# Patient Record
Sex: Male | Born: 1985 | Race: White | Hispanic: No | Marital: Single | Smoking: Current some day smoker
Health system: Southern US, Community
[De-identification: ages and names within clinical notes are randomized; demographics above are authoritative.]

## PROBLEM LIST (undated history)

## (undated) DIAGNOSIS — F319 Bipolar disorder, unspecified: Secondary | ICD-10-CM

## (undated) DIAGNOSIS — F419 Anxiety disorder, unspecified: Secondary | ICD-10-CM

## (undated) DIAGNOSIS — F32A Depression, unspecified: Secondary | ICD-10-CM

## (undated) DIAGNOSIS — F329 Major depressive disorder, single episode, unspecified: Secondary | ICD-10-CM

## (undated) HISTORY — DX: Depression, unspecified: F32.A

## (undated) HISTORY — DX: Bipolar disorder, unspecified: F31.9

## (undated) HISTORY — DX: Anxiety disorder, unspecified: F41.9

## (undated) HISTORY — DX: Major depressive disorder, single episode, unspecified: F32.9

## (undated) HISTORY — PX: WISDOM TOOTH EXTRACTION: SHX21

## (undated) HISTORY — PX: WRIST ARTHROSCOPY: SUR100

---
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
## 2011-11-10 ENCOUNTER — Ambulatory Visit (INDEPENDENT_AMBULATORY_CARE_PROVIDER_SITE_OTHER): Payer: BC Managed Care – PPO

## 2011-11-10 DIAGNOSIS — J069 Acute upper respiratory infection, unspecified: Secondary | ICD-10-CM

## 2011-11-10 DIAGNOSIS — J209 Acute bronchitis, unspecified: Secondary | ICD-10-CM

## 2011-12-02 ENCOUNTER — Ambulatory Visit (INDEPENDENT_AMBULATORY_CARE_PROVIDER_SITE_OTHER): Payer: BC Managed Care – PPO | Admitting: Family Medicine

## 2011-12-02 VITALS — BP 122/85 | HR 68 | Temp 97.9°F | Resp 16 | Ht 69.5 in | Wt 277.2 lb

## 2011-12-02 DIAGNOSIS — J40 Bronchitis, not specified as acute or chronic: Secondary | ICD-10-CM

## 2011-12-02 DIAGNOSIS — E669 Obesity, unspecified: Secondary | ICD-10-CM

## 2011-12-02 DIAGNOSIS — F319 Bipolar disorder, unspecified: Secondary | ICD-10-CM

## 2011-12-02 DIAGNOSIS — J0110 Acute frontal sinusitis, unspecified: Secondary | ICD-10-CM

## 2011-12-02 MED ORDER — IPRATROPIUM BROMIDE 0.03 % NA SOLN: 2.0000 | Freq: Three times a day (TID) | NASAL | Status: DC

## 2011-12-02 MED ORDER — CEFDINIR 300 MG PO CAPS: 300.0000 mg | ORAL_CAPSULE | Freq: Two times a day (BID) | ORAL | Status: DC

## 2011-12-02 NOTE — Patient Instructions (Signed)
Take some probiotics to help prevent diarrhea.  Let us know if you are not better in the next few days!

## 2011-12-02 NOTE — Progress Notes (Signed)
  Patient Name: Alex Gibson Mun Date of Birth: Jun 18, 1986 Medical Record Number: [id number] Gender: male Date of Encounter: 12/02/2011  History of Present Illness:  Alex Gibson is a 26 y.o. very pleasant male patient who presents with the following:  Right after Christmas noted onset of chest congestions, runny vs stuffy nose, sinus pressure, ears congested.  Was seen at [facility] on 11/10/11 and treated with amoxicillin.  Did get better temporarily but then sx returned, sinus pressure is worse.  Today noted sweats, felt hot- ? Had a fever.  Occasional chills.  Cough- productive of yellow mucus.    There is no problem list on file for this patient.  No past medical history on file. history of bipolar disorder, well controlled No past surgical history on file. History  Substance Use Topics  . Smoking status: Never Smoker   . Smokeless tobacco: Not on file  . Alcohol Use: Not on file   No family history on file. Allergies no known allergies  Medication list has been reviewed and updated.  Review of Systems: As per HPI.  Mild nausea due to PND, no vomiting.  Had diarrhea last night but better now.  Able to eat ok.  No rashes  Physical Examination: Filed Vitals:   12/02/11 1452  BP: 122/85  Pulse: 68  Temp: 97.9 F (36.6 C)  TempSrc: Oral  Resp: 16  Height: 5' 9.5" (1.765 m)  Weight: 277 lb 3.2 oz (125.737 kg)    Body mass index is 40.35 kg/(m^2).  GEN: WDWN, NAD, Non-toxic, A & O x 3 HEENT: Atraumatic, Normocephalic. Neck supple. No masses, No LAD.  Frontal sinuses are tender Ears and Nose: No external deformity.  Nasal cavity with redness and mucus CV: RRR, No M/G/R. No JVD. No thrill. No extra heart sounds. PULM: CTA B, no wheezes, crackles, rhonchi. No retractions. No resp. distress. No accessory muscle use. ABD: S, NT, ND, +BS. No rebound. No HSM. EXTR: No c/c/e NEURO Normal gait.  PSYCH: Normally interactive. Conversant. Not depressed or anxious appearing.  Calm  demeanor.    Assessment and Plan: 1. Sinusitis, acute frontal  cefdinir (OMNICEF) 300 MG capsule, ipratropium (ATROVENT) 0.03 % nasal spray  2. Bronchitis  cefdinir (OMNICEF) 300 MG capsule    Patient (or parent if minor) instructed to return to clinic or call if not better in 3 day(s).

## 2011-12-04 ENCOUNTER — Ambulatory Visit (INDEPENDENT_AMBULATORY_CARE_PROVIDER_SITE_OTHER): Payer: BC Managed Care – PPO | Admitting: Internal Medicine

## 2011-12-04 ENCOUNTER — Ambulatory Visit: Payer: BC Managed Care – PPO

## 2011-12-04 ENCOUNTER — Encounter: Payer: Self-pay | Admitting: Physician Assistant

## 2011-12-04 VITALS — BP 127/80 | HR 83 | Temp 100.4°F | Resp 16 | Ht 69.0 in | Wt 278.2 lb

## 2011-12-04 DIAGNOSIS — R05 Cough: Secondary | ICD-10-CM

## 2011-12-04 DIAGNOSIS — R52 Pain, unspecified: Secondary | ICD-10-CM

## 2011-12-04 DIAGNOSIS — R509 Fever, unspecified: Secondary | ICD-10-CM

## 2011-12-04 DIAGNOSIS — J157 Pneumonia due to Mycoplasma pneumoniae: Secondary | ICD-10-CM

## 2011-12-04 LAB — POCT CBC
Granulocyte percent: 79 %G (ref 37–80)
HCT, POC: 44.1 % (ref 43.5–53.7)
Hemoglobin: 14.6 g/dL (ref 14.1–18.1)
Lymph, poc: 0.7 (ref 0.6–3.4)
MCH, POC: 28.5 pg (ref 27–31.2)
MCHC: 33.1 g/dL (ref 31.8–35.4)
MCV: 86.2 fL (ref 80–97)
MID (cbc): 0.6 (ref 0–0.9)
MPV: 11.9 fL (ref 0–99.8)
POC Granulocyte: 4.7 (ref 2–6.9)
POC LYMPH PERCENT: 11.6 %L (ref 10–50)
POC MID %: 9.4 %M (ref 0–12)
Platelet Count, POC: 155 10*3/uL (ref 142–424)
RBC: 5.12 M/uL (ref 4.69–6.13)
RDW, POC: 13.8 %
WBC: 6 10*3/uL (ref 4.6–10.2)

## 2011-12-04 LAB — POCT INFLUENZA A/B
Influenza A, POC: NEGATIVE
Influenza B, POC: NEGATIVE

## 2011-12-04 MED ORDER — AZITHROMYCIN 1 G PO PACK: 1.0000 | PACK | Freq: Once | ORAL | Status: DC

## 2011-12-04 MED ORDER — AZITHROMYCIN 250 MG PO TABS: ORAL_TABLET | ORAL | Status: AC

## 2011-12-04 MED ORDER — ACETAMINOPHEN 325 MG PO TABS
500.0000 mg | ORAL_TABLET | Freq: Once | ORAL | Status: AC
  Administered 2011-12-04: 487.5 mg via ORAL

## 2011-12-04 NOTE — Progress Notes (Signed)
  Subjective:    Patient ID: Alex Gibson, male    DOB: 21-Nov-1985, 26 y.o.   MRN: [id number]  HPI @[facility] @  HOPPER,DAVID, MD, MD  26 y.o. y/o CM presents with worsening URI s/sx.  Was here in December and again December 02, 2011 for URI S/sx.  Symptoms worsened acutely yesterday and developed fever and body aches. No flu shot this year Onset Yesterday Location- all over body Duration 1.5 days Severity moderate Exacerbating/Alleviating Factors-tylenol helps  Associated Signs and Symptoms-fever, bodyaches   Status of chronic disease  Past Medical History  Diagnosis Date  . Bipolar disorder     Dr. Alphia Kava.  well controlled    Past Surgical History  Procedure Date  . Wisdom tooth extraction     Review of Systems  Constitutional: Positive for fever, chills and fatigue.  HENT: Positive for congestion and postnasal drip. Negative for rhinorrhea and sneezing.   Eyes: Negative.   Respiratory: Positive for cough.   Cardiovascular: Negative.   Gastrointestinal: Negative.   Musculoskeletal: Positive for myalgias.  Neurological: Positive for headaches.  Hematological: Negative.   Psychiatric/Behavioral: Negative.        Objective:   Physical Exam  Nursing note and vitals reviewed. Constitutional: He is oriented to person, place, and time. He appears well-developed and well-nourished. No distress (obese).  HENT:  Head: Normocephalic and atraumatic.  Right Ear: External ear normal.  Left Ear: External ear normal.  Mouth/Throat: Oropharynx is clear and moist.  Eyes: Conjunctivae and EOM are normal. Pupils are equal, round, and reactive to light.  Neck: Normal range of motion. Neck supple. No thyromegaly present.  Pulmonary/Chest: Effort normal. No respiratory distress. He has no wheezes. He has rales (left base). He exhibits no tenderness.  Musculoskeletal: Normal range of motion.  Lymphadenopathy:    He has no cervical adenopathy.  Neurological: He is alert and  oriented to person, place, and time.  Skin: Skin is warm and dry. No rash noted.  Psychiatric: He has a normal mood and affect.   Results for orders placed in visit on 12/04/11  POCT INFLUENZA A/B      Component Value Range   Influenza A, POC Negative     Influenza B, POC Negative    POCT CBC      Component Value Range   WBC 6.0  4.6 - 10.2 (K/uL)   Lymph, poc 0.7  0.6 - 3.4    POC LYMPH PERCENT 11.6  10 - 50 (%L)   MID (cbc) 0.6  0 - 0.9    POC MID % 9.4  0 - 12 (%M)   POC Granulocyte 4.7  2 - 6.9    Granulocyte percent 79.0  37 - 80 (%G)   RBC 5.12  4.69 - 6.13 (M/uL)   Hemoglobin 14.6  14.1 - 18.1 (g/dL)   HCT, POC 40.9  81.1 - 53.7 (%)   MCV 86.2  80 - 97 (fL)   MCH, POC 28.5  27 - 31.2 (pg)   MCHC 33.1  31.8 - 35.4 (g/dL)   RDW, POC 91.4     Platelet Count, POC 155  142 - 424 (K/uL)   MPV 11.9  0 - 99.8 (fL)   chest X-ray Seen with Dr. Perrin Maltese.  Increased markings R & L base      Assessment & Plan:  URI-cover for atypicals. D/C cefdinir.  Start Zpack.  See AVS

## 2011-12-04 NOTE — Patient Instructions (Signed)
Fluids and rest, Respiratory Care.  Tylenol or Advil if tolerated for fever and body aches.

## 2011-12-08 ENCOUNTER — Ambulatory Visit (INDEPENDENT_AMBULATORY_CARE_PROVIDER_SITE_OTHER): Payer: BC Managed Care – PPO | Admitting: Family Medicine

## 2011-12-08 VITALS — BP 124/70 | HR 77 | Temp 98.0°F | Resp 16 | Ht 69.0 in | Wt 271.2 lb

## 2011-12-08 DIAGNOSIS — R05 Cough: Secondary | ICD-10-CM

## 2011-12-08 DIAGNOSIS — J00 Acute nasopharyngitis [common cold]: Secondary | ICD-10-CM

## 2011-12-08 DIAGNOSIS — J069 Acute upper respiratory infection, unspecified: Secondary | ICD-10-CM

## 2011-12-08 DIAGNOSIS — J310 Chronic rhinitis: Secondary | ICD-10-CM

## 2011-12-08 DIAGNOSIS — R059 Cough, unspecified: Secondary | ICD-10-CM

## 2011-12-08 MED ORDER — FLUTICASONE PROPIONATE 50 MCG/ACT NA SUSP: 2.0000 | Freq: Every day | NASAL | Status: DC

## 2011-12-08 MED ORDER — HYDROCODONE-HOMATROPINE 5-1.5 MG/5ML PO SYRP: 5.0000 mL | ORAL_SOLUTION | Freq: Three times a day (TID) | ORAL | Status: AC | PRN

## 2011-12-08 NOTE — Patient Instructions (Signed)
Discharge Instructions Browse by Alphabet A B C D E F G H I J K L M N O P Q R S T U V W X Y Z  Browse by Category All Documents Allergy and Immunology Anesthesiology Bariatrics Bioterrorism Cardiology Critical Care Dentistry Dermatology Diabetes Dietary Easy-to-Read Emergency Medicine Endocrinology ENT Family Medicine Forms Gastroenterology Geriatrics Infectious Disease Internal Medicine Labs and Tests Neonatology Nephrology Neurology Obstetrics and Gynecology Oncology Ophthalmology Orthopedics Pediatrics Pharmacology Physical Medicine and Rehabilitation Podiatry Preventative Medicine Procedures Psychiatry Pulmonary Medicine Radiology Rheumatology Surgery Urology Drug Information Sheets All Drug Information Sheets  Browse by Alphabet A B C D E F G H I J K L M N O P Q R S T U V W X Y Z  

## 2011-12-08 NOTE — Progress Notes (Signed)
[facility]:  Office Visit  Chief Complaint:  Chief Complaint  Patient presents with  . Pneumonia    dx'd friday    HPI: Alex Gibson is a 26 y.o. male who complains of here for follow-up for URI, have been on 2 rounds of abx, Omnicef and Z-pack. CXR was negative for any infiltrates. He is here with continual complaints of chest and sinus congestion. No fevers or chills.   Past Medical History  Diagnosis Date  . Bipolar disorder     Dr. Alphia Kava.  well controlled   Past Surgical History  Procedure Date  . Wisdom tooth extraction    History   Social History  . Marital Status: Single    Spouse Name: N/A    Number of Children: N/A  . Years of Education: N/A   Social History Main Topics  . Smoking status: Never Smoker   . Smokeless tobacco: Never Used  . Alcohol Use: No  . Drug Use: No  . Sexually Active: Not on file   Other Topics Concern  . Not on file   Social History Narrative  . No narrative on file   No family history on file. No Known Allergies Prior to Admission medications   Medication Sig Start Date End Date Taking? Authorizing Provider  acetaminophen (TYLENOL) 325 MG tablet Take 650 mg by mouth every 6 (six) hours as needed. Took the tylenol rapid release at 11 am   Yes Historical Provider, MD  azithromycin (ZITHROMAX) 250 MG tablet 2 tabs today. 1 tab daily days 2-5 12/04/11 12/09/11 Yes Georgian Co, PA-C  buPROPion (WELLBUTRIN XL) 150 MG 24 hr tablet Take 150 mg by mouth 2 (two) times daily.   Yes Historical Provider, MD  ipratropium (ATROVENT) 0.03 % nasal spray Place 2 sprays into the nose 3 (three) times daily. 12/02/11 12/01/12 Yes Jessica Copland, MD  lithium 300 MG tablet Take 300 mg by mouth 3 (three) times daily.   Yes Historical Provider, MD     ROS: The patient denies fevers, chills, night sweats, unintentional weight loss, chest pain, palpitations, wheezing, dyspnea on exertion, nausea, vomiting, abdominal pain, dysuria,  hematuria, melena, numbness, weakness, or tingling. + SOB with exertion. + sinus pressure and congestion. +cough  All other systems have been reviewed and were otherwise negative with the exception of those mentioned in the HPI and as above.    PHYSICAL EXAM: Filed Vitals:   12/08/11 1742  BP: 124/70  Pulse: 77  Temp: 98 F (36.7 C)  Resp: 16   Filed Vitals:   12/08/11 1742  Height: 5\' 9"  (1.753 m)  Weight: 271 lb 3.2 oz (123.016 kg)   Body mass index is 40.05 kg/(m^2).  General: Alert, no acute distress HEENT:  Normocephalic, atraumatic, oropharynx patent. Erythematous nasal passage, boggy. Cardiovascular:  Regular rate and rhythm, no rubs murmurs or gallops.  No Carotid bruits, radial pulse intact. No pedal edema.  Respiratory: Clear to auscultation bilaterally.  No wheezes, rales, or rhonchi.  No cyanosis, no use of accessory musculature GI: No organomegaly, abdomen is soft and non-tender, positive bowel sounds.  No masses. Skin: No rashes. Neurologic: Facial musculature symmetric. Psychiatric: Patient is appropriate throughout our interaction. Lymphatic: No axillary or cervical lymphadenopathy Musculoskeletal: Gait intact. Genitourinary:   LABS: Results for orders placed in visit on 12/04/11  POCT INFLUENZA A/B      Component Value Range   Influenza A, POC Negative     Influenza B, POC Negative  POCT CBC      Component Value Range   WBC 6.0  4.6 - 10.2 (K/uL)   Lymph, poc 0.7  0.6 - 3.4    POC LYMPH PERCENT 11.6  10 - 50 (%L)   MID (cbc) 0.6  0 - 0.9    POC MID % 9.4  0 - 12 (%M)   POC Granulocyte 4.7  2 - 6.9    Granulocyte percent 79.0  37 - 80 (%G)   RBC 5.12  4.69 - 6.13 (M/uL)   Hemoglobin 14.6  14.1 - 18.1 (g/dL)   HCT, POC 78.2  95.6 - 53.7 (%)   MCV 86.2  80 - 97 (fL)   MCH, POC 28.5  27 - 31.2 (pg)   MCHC 33.1  31.8 - 35.4 (g/dL)   RDW, POC 21.3     Platelet Count, POC 155  142 - 424 (K/uL)   MPV 11.9  0 - 99.8 (fL)     EKG/XRAY:   Official  read by radiology on 12/04/11 CXR was negative for any acute cardiopulmonary process.    ASSESSMENT/PLAN: 1. Viral URI-most likely viral URI since has been on 2 rounds of abx. Sxs treatment.  2. Rhinitis-Rx Flonase 3. Cough-Hydromet Rx    LE, THAO PHUONG, DO 12/08/2011 6:00 PM

## 2012-07-01 ENCOUNTER — Ambulatory Visit: Payer: BC Managed Care – PPO

## 2012-07-01 ENCOUNTER — Ambulatory Visit (INDEPENDENT_AMBULATORY_CARE_PROVIDER_SITE_OTHER): Payer: BC Managed Care – PPO | Admitting: Internal Medicine

## 2012-07-01 ENCOUNTER — Encounter: Payer: Self-pay | Admitting: Internal Medicine

## 2012-07-01 VITALS — BP 108/70 | HR 68 | Temp 98.0°F | Resp 17 | Ht 70.0 in | Wt 230.0 lb

## 2012-07-01 DIAGNOSIS — M25539 Pain in unspecified wrist: Secondary | ICD-10-CM

## 2012-07-01 DIAGNOSIS — M25531 Pain in right wrist: Secondary | ICD-10-CM

## 2012-07-01 DIAGNOSIS — M779 Enthesopathy, unspecified: Secondary | ICD-10-CM

## 2012-07-01 NOTE — Progress Notes (Signed)
  Subjective:    Patient ID: Alex Gibson, male    DOB: 1986-09-27, 26 y.o.   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 Usually healthy   Review of Systems     Objective:   Physical Exam Right wrist full rom with pain on extention NMS intact  [facility] reading (PRIMARY) by  DrGuest wrist nad       Assessment & Plan:  Strain wrist with tendonitis Splint/ alieve

## 2012-07-28 ENCOUNTER — Ambulatory Visit (INDEPENDENT_AMBULATORY_CARE_PROVIDER_SITE_OTHER): Payer: BC Managed Care – PPO | Admitting: Emergency Medicine

## 2012-07-28 VITALS — BP 118/74 | HR 66 | Temp 97.9°F | Resp 18 | Ht 70.5 in | Wt 227.0 lb

## 2012-07-28 DIAGNOSIS — S63509A Unspecified sprain of unspecified wrist, initial encounter: Secondary | ICD-10-CM

## 2012-07-28 MED ORDER — NAPROXEN SODIUM 550 MG PO TABS: 550.0000 mg | ORAL_TABLET | Freq: Two times a day (BID) | ORAL | Status: DC

## 2012-07-28 NOTE — Progress Notes (Signed)
[facility] [address], [place] [address] [phone]  Date:  07/28/2012   Name:  Alex Gibson   DOB:  1986/01/30   MRN:  [id number]  PCP:  Janace Hoard, MD    Chief Complaint: Wrist Pain   History of Present Illness:  Alex Gibson is a 26 y.o. very pleasant male patient who presents with the following:  Injured wrist playing tennis 6 weeks ago. Seen in office and negative xray a month ago.   Splinted for 10 days and continued to have pain with flexion and extension of the wrist while playing tennis.  Denies fall on outstretched hand.  Denies other complaints.  Patient Active Problem List  Diagnosis  . Bipolar disorder  . Obesity    Past Medical History  Diagnosis Date  . Bipolar disorder     Dr. Alphia Kava.  well controlled    Past Surgical History  Procedure Date  . Wisdom tooth extraction     History  Substance Use Topics  . Smoking status: Never Smoker   . Smokeless tobacco: Never Used  . Alcohol Use: No    No family history on file.  No Known Allergies  Medication list has been reviewed and updated.  Current Outpatient Prescriptions on File Prior to Visit  Medication Sig Dispense Refill  . acetaminophen (TYLENOL) 325 MG tablet Take 650 mg by mouth every 6 (six) hours as needed. Took the tylenol rapid release at 11 am      . ARIPiprazole (ABILIFY) 10 MG tablet Take 10 mg by mouth daily.        Review of Systems:  As per HPI, otherwise negative.    Physical Examination: Filed Vitals:   07/28/12 0942  BP: 118/74  Pulse: 66  Temp: 97.9 F (36.6 C)  Resp: 18   Filed Vitals:   07/28/12 0942  Height: 5' 10.5" (1.791 m)  Weight: 227 lb (102.967 kg)   Body mass index is 32.11 kg/(m^2). Ideal Body Weight: Weight in (lb) to have BMI = 25: 176.4    GEN: WDWN, NAD, Non-toxic, Alert & Oriented x 3 HEENT: Atraumatic, Normocephalic.  Ears and Nose: No external deformity. EXTR: No clubbing/cyanosis/edema NEURO:  Normal gait.  PSYCH: Normally interactive. Conversant. Not depressed or anxious appearing.  Calm demeanor.  RIGHT WRIST:  Full active and passive ROM.  No crepitus or tenderness.  NATI.  Pain with forced extension or flexion.  Assessment and Plan: Sprain wrist Continue immobilization Anaprox ds Refer to sports medicine clinic  Carmelina Dane, MD  I have reviewed and agree with documentation. Robert P. Merla Riches, M.D.

## 2012-08-01 ENCOUNTER — Ambulatory Visit (INDEPENDENT_AMBULATORY_CARE_PROVIDER_SITE_OTHER): Payer: BC Managed Care – PPO | Admitting: Sports Medicine

## 2012-08-01 DIAGNOSIS — R69 Illness, unspecified: Secondary | ICD-10-CM

## 2012-08-01 NOTE — Progress Notes (Signed)
  Subjective:    Patient ID: Alex Gibson, male    DOB: 1986/08/13, 26 y.o.   MRN: [id number]  HPI Patient did not show for this appointment.   Review of Systems     Objective:   Physical Exam        Assessment & Plan:

## 2012-08-17 ENCOUNTER — Ambulatory Visit: Payer: BC Managed Care – PPO

## 2012-08-17 ENCOUNTER — Ambulatory Visit (INDEPENDENT_AMBULATORY_CARE_PROVIDER_SITE_OTHER): Payer: BC Managed Care – PPO | Admitting: Family Medicine

## 2012-08-17 VITALS — BP 118/70 | HR 70 | Temp 97.9°F | Resp 18 | Ht 69.75 in | Wt 224.2 lb

## 2012-08-17 DIAGNOSIS — M25539 Pain in unspecified wrist: Secondary | ICD-10-CM

## 2012-08-17 DIAGNOSIS — S6980XA Other specified injuries of unspecified wrist, hand and finger(s), initial encounter: Secondary | ICD-10-CM | POA: Insufficient documentation

## 2012-08-17 DIAGNOSIS — M24139 Other articular cartilage disorders, unspecified wrist: Secondary | ICD-10-CM

## 2012-08-17 MED ORDER — PREDNISONE 50 MG PO TABS: ORAL_TABLET | ORAL | Status: DC

## 2012-08-17 MED ORDER — MELOXICAM 15 MG PO TABS: ORAL_TABLET | ORAL | Status: DC

## 2012-08-17 NOTE — Assessment & Plan Note (Signed)
With patient's clinical exam today and negative x-rays concerned significantly for a TFCC injury. Patient given exercises to come out of the splint twice a day to do. Patient also given anti-inflammatories including meloxicam and prednisone to take. Patient will try this for 2 weeks and return. Patient will also stay in the splint during his regular daily activities. Patient will come back in 2 weeks for further evaluation. If he still has considerable amount of tenderness we will need to consider referral to orthopedic surgeon as well as an MR arthrogram.

## 2012-08-17 NOTE — Progress Notes (Signed)
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 He was evaluated 6 weeks ago with an acute sprained wrist after hitting her forearm shot in tennis. The pain had subsided and he started hitting balls again today and this morning developed acute wrist pain again in the volar right wrist. The pain is worse at the volar distal ulna but there is no point tenderness. He is particularly uncomfortable with pronation and supination.  Objective: Examination of the wrist: Inspection: Normal Range of motion: Obvious pain with even modest pronation and supination, side flexion toward the ulna Palpation: Normal [facility] reading (PRIMARY) by  Dr. Milus Glazier right wrist:  No bony abnormality  Assessment: wrist sprain, recurrent, right sided.

## 2012-08-17 NOTE — Progress Notes (Signed)
  Subjective:    Patient ID: Alex Gibson, male    DOB: 06/30/1986, 26 y.o.   MRN: [id number]  HPI Patient is here for followup of his right wrist injury. Patient has been immobilized for the last 6 weeks. Patient's did attempt to try to play some tennis and had significant pain immediately. Patient is back for reevaluation. Patient did have x-rays done which I reviewed today. Patient has no bony abnormalities but does have medial aspect that is concerning for a TFCC tear. Patient's pain is mostly in the ulnar aspect of the dorsal part of the wrist. Patient denies any swelling or any numbness in the extremity. Patient also denies any weakness states once again he has a significant amount of pain. 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 Patient has not taken any medications.    Review of Systems As stated above in history of present illness    Objective:   Physical Exam Blood pressure 118/70, pulse 70, temperature 97.9 F (36.6 C), temperature source Oral, resp. rate 18, height 5' 9.75" (1.772 m), weight 224 lb 3.2 oz (101.696 kg), SpO2 100.00%.  General: No apparent distress alert and oriented x3 mood and affect normal Right wrist exam: On inspection patient has no gross abnormalities. Patient on how patient does have significant tenderness over the ulnar aspect just distal to the palm. Patient has no pain over the distal ulnar or distal radius. Patient also has no pain on the anatomical snuff box. Patient has significant amount of pain with abduction in dorsiflexion. Negative Tinel's and Phalen's sign. Neurovascularly intact distally     Assessment & Plan:

## 2012-08-29 ENCOUNTER — Ambulatory Visit (INDEPENDENT_AMBULATORY_CARE_PROVIDER_SITE_OTHER): Payer: BC Managed Care – PPO | Admitting: Internal Medicine

## 2012-08-29 VITALS — BP 126/74 | HR 85 | Temp 98.4°F | Resp 17 | Ht 69.0 in | Wt 237.0 lb

## 2012-08-29 DIAGNOSIS — M24139 Other articular cartilage disorders, unspecified wrist: Secondary | ICD-10-CM

## 2012-08-29 DIAGNOSIS — S6980XA Other specified injuries of unspecified wrist, hand and finger(s), initial encounter: Secondary | ICD-10-CM

## 2012-08-30 ENCOUNTER — Encounter: Payer: Self-pay | Admitting: Internal Medicine

## 2012-08-30 NOTE — Progress Notes (Signed)
  Subjective:    Patient ID: Alex Gibson, male    DOB: [birth year], 26 y.o.   MRN: [id number]  HPISince his last office visit 2 weeks ago he has had no improvement in the pain in his wrist He continues to be unable to pushoff, play tennis, or do pushups The pain continues to be along the lateral aspect of the wrist without significant swelling    Review of Systems     Objective:   Physical Exam His right wrist is nonswollen There is tenderness distal to the ulnar styloid just proximal to the fifth metacarpal Pain with radial deviation and wrist extension       Assessment & Plan:  Problem #1 wrist pain secondary to injury to the triangular fibrocartilage complex Will set up orthopedic referral for further evaluation and treatment

## 2012-10-06 ENCOUNTER — Ambulatory Visit (INDEPENDENT_AMBULATORY_CARE_PROVIDER_SITE_OTHER): Payer: BC Managed Care – PPO | Admitting: Family Medicine

## 2012-10-06 VITALS — BP 127/79 | HR 87 | Temp 99.4°F | Resp 17 | Ht 69.5 in | Wt 245.0 lb

## 2012-10-06 DIAGNOSIS — R1013 Epigastric pain: Secondary | ICD-10-CM

## 2012-10-06 LAB — COMPREHENSIVE METABOLIC PANEL
Albumin: 4.7 g/dL (ref 3.5–5.2)
Alkaline Phosphatase: 58 U/L (ref 39–117)
BUN: 13 mg/dL (ref 6–23)
Creat: 0.94 mg/dL (ref 0.50–1.35)
Glucose, Bld: 87 mg/dL (ref 70–99)
Potassium: 4.6 mEq/L (ref 3.5–5.3)
Total Bilirubin: 1.1 mg/dL (ref 0.3–1.2)

## 2012-10-06 LAB — POCT CBC
Granulocyte percent: 76.5 %G (ref 37–80)
Hemoglobin: 17.7 g/dL (ref 14.1–18.1)
MCH, POC: 28.9 pg (ref 27–31.2)
MCHC: 32.2 g/dL (ref 31.8–35.4)
MCV: 89.8 fL (ref 80–97)
POC Granulocyte: 4.1 (ref 2–6.9)
POC MID %: 11.1 %M (ref 0–12)
Platelet Count, POC: 209 10*3/uL (ref 142–424)
RBC: 6.13 M/uL (ref 4.69–6.13)
RDW, POC: 13.9 %

## 2012-10-06 MED ORDER — SUCRALFATE 1 G PO TABS: 1.0000 g | ORAL_TABLET | Freq: Four times a day (QID) | ORAL | Status: DC

## 2012-10-06 MED ORDER — GI COCKTAIL ~~LOC~~: 30.0000 mL | Freq: Once | ORAL | Status: DC

## 2012-10-06 NOTE — Progress Notes (Signed)
[facility] [address] [place] [address] [phone]  Date:  10/06/2012   Name:  Alex Gibson   DOB:  12/19/1985   MRN:  [id number]  PCP:  Janace Hoard, MD    Chief Complaint: Chest Pain, Diarrhea and Emesis   History of Present Illness:  Alex Gibson is a 26 y.o. very pleasant male patient who presents with the following:  History of bipolar disorder controlled with Abilify.   He is here today with illness. Today is Thursday.  On Tuesday he awoke with chest/ epigastric pain.  He slept most of the day.  Around 9pm he tried to eat, and this made the pain worse and he threw up.  He then felt a lot better.  He also had diarrhea, chills, aches.    He continues to feel very tired, sleeping a lot.  He continues to note a "slight pressure" in the epigastiric area.   He has slight diarrhea now.   He has not vomited again. He does not feel nauseated any more.   He has been able to eat some but has not had a lot of appetite.  He feels hot but does not have chills any more.    He has a distant history of GERD in middle school but is not really sure what that felt like.    No suspicious foods, no other sick contacts.    He tried an antacid on Tuesday- did not seem to help.    He has not history of heart trouble.   He does have a little pain with deep breath.   No recent abx use. No recent excessive NSAID use  He is seeing Dr. Melvyn Novas for his right wrist- they do plan to do surgery in a couple of weeks.    Patient Active Problem List  Diagnosis  . Bipolar disorder  . Obesity  . TFCC (triangular fibrocartilage complex) injury    Past Medical History  Diagnosis Date  . Bipolar disorder     Dr. Alphia Kava.  well controlled  . Anxiety   . Depression     Past Surgical History  Procedure Date  . Wisdom tooth extraction     History  Substance Use Topics  . Smoking status: Never Smoker   . Smokeless tobacco: Never Used  . Alcohol Use: No     Family History  Problem Relation Age of Onset  . Diabetes Father     No Known Allergies  Medication list has been reviewed and updated.  Current Outpatient Prescriptions on File Prior to Visit  Medication Sig Dispense Refill  . ARIPiprazole (ABILIFY) 10 MG tablet Take 10 mg by mouth daily.      Marland Kitchen acetaminophen (TYLENOL) 325 MG tablet Take 650 mg by mouth every 6 (six) hours as needed. Took the tylenol rapid release at 11 am      . meloxicam (MOBIC) 15 MG tablet Take one pill daily for the next 2 weeks then as needed thereafter.  30 tablet  1  . naproxen sodium (ANAPROX DS) 550 MG tablet Take 1 tablet (550 mg total) by mouth 2 (two) times daily with a meal.  40 tablet  0  . predniSONE (DELTASONE) 50 MG tablet Take one pill daily for 5 days  5 tablet  0    Review of Systems:  As per HPI- otherwise negative.   Physical Examination: Filed Vitals:   10/06/12 0818  BP: 127/79  Pulse: 87  Temp:  99.4 F (37.4 C)  Resp: 17   Filed Vitals:   10/06/12 0818  Height: 5' 9.5" (1.765 m)  Weight: 245 lb (111.131 kg)   Body mass index is 35.66 kg/(m^2). Ideal Body Weight: Weight in (lb) to have BMI = 25: 171.4   GEN: WDWN, NAD, Non-toxic, A & O x 3, overweight HEENT: Atraumatic, Normocephalic. Neck supple. No masses, No LAD. Bilateral TM wnl, oropharynx normal.  PEERL,EOMI.   Ears and Nose: No external deformity. CV: RRR, No M/G/R. No JVD. No thrill. No extra heart sounds. PULM: CTA B, no wheezes, crackles, rhonchi. No retractions. No resp. distress. No accessory muscle use. ABD: S,  ND, +BS. No rebound. No HSM. Slight to moderate epigastric tenderness, negative murphy's sign EXTR: No c/c/e NEURO Normal gait.  PSYCH: Normally interactive. Conversant. Not depressed or anxious appearing.  Calm demeanor.   GI cocktail: relieved his pain, epigastric area non- tender on re-exam after medication  Results for orders placed in visit on 10/06/12  POCT CBC      Component Value Range    WBC 5.4  4.6 - 10.2 K/uL   Lymph, poc 0.7  0.6 - 3.4   POC LYMPH PERCENT 12.4  10 - 50 %L   MID (cbc) 0.6  0 - 0.9   POC MID % 11.1  0 - 12 %M   POC Granulocyte 4.1  2 - 6.9   Granulocyte percent 76.5  37 - 80 %G   RBC 6.13  4.69 - 6.13 M/uL   Hemoglobin 17.7  14.1 - 18.1 g/dL   HCT, POC 40.9 (*) 81.1 - 53.7 %   MCV 89.8  80 - 97 fL   MCH, POC 28.9  27 - 31.2 pg   MCHC 32.2  31.8 - 35.4 g/dL   RDW, POC 91.4     Platelet Count, POC 209  142 - 424 K/uL   MPV 11.4  0 - 99.8 fL     Assessment and Plan: 1. Epigastric abdominal pain  POCT CBC, Comprehensive metabolic panel, gi cocktail (Maalox,Lidocaine,Donnatal), sucralfate (CARAFATE) 1 G tablet   Abdominal pain due to GERD/ gastritis.  Discussed a bland, low acid diet.  He will use carafate and prilosec OTC. Await CMP and will follow- up.  If any severe symptoms or worsening he is to call or go to ED if necessary.   Abbe Amsterdam, MD

## 2012-10-06 NOTE — Patient Instructions (Addendum)
It seems that your pain was coming from your stomach.  Eat a bland diet- avoid acidic or spicy foods.  Use the carafate before meals and before bed, and buy an OTC proton- pump inhibitor -prilosec OTC is a good choice.    Let us know if your symptoms persist or get worse again.

## 2013-05-08 ENCOUNTER — Ambulatory Visit (INDEPENDENT_AMBULATORY_CARE_PROVIDER_SITE_OTHER): Payer: BC Managed Care – PPO | Admitting: Family Medicine

## 2013-05-08 VITALS — BP 112/68 | HR 84 | Temp 98.0°F | Resp 17 | Ht 71.0 in | Wt 297.0 lb

## 2013-05-08 DIAGNOSIS — J3489 Other specified disorders of nose and nasal sinuses: Secondary | ICD-10-CM

## 2013-05-08 DIAGNOSIS — J019 Acute sinusitis, unspecified: Secondary | ICD-10-CM

## 2013-05-08 DIAGNOSIS — R0989 Other specified symptoms and signs involving the circulatory and respiratory systems: Secondary | ICD-10-CM

## 2013-05-08 LAB — POCT CBC
Granulocyte percent: 80.8 %G — AB (ref 37–80)
Lymph, poc: 1.8 (ref 0.6–3.4)
MCHC: 33.3 g/dL (ref 31.8–35.4)
MID (cbc): 0.7 (ref 0–0.9)
MPV: 10.5 fL (ref 0–99.8)
POC Granulocyte: 10.3 — AB (ref 2–6.9)
POC LYMPH PERCENT: 14.1 %L (ref 10–50)
POC MID %: 5.1 %M (ref 0–12)
Platelet Count, POC: 236 10*3/uL (ref 142–424)
RDW, POC: 14.3 %

## 2013-05-08 MED ORDER — FLUTICASONE PROPIONATE 50 MCG/ACT NA SUSP: 2.0000 | Freq: Every day | NASAL | Status: DC

## 2013-05-08 MED ORDER — AMOXICILLIN 875 MG PO TABS: 875.0000 mg | ORAL_TABLET | Freq: Two times a day (BID) | ORAL | Status: DC

## 2013-05-08 NOTE — Progress Notes (Signed)
Subjective: 27 year old Alex Gibson from [facility] who has had sinus pressure and some congestion over the last 2 weeks. He felt worse today. He has the facial pressure and some headache. He felt disoriented, such as when he went to find his car he couldn't remember where he is put it and he didn't remember having gone out to the car for something. He is not smoke drink or use drugs. He is on bipolar medication, and this morning took a Advil sinus cough medication, but that was after he had the sense of confusion. He has chest congestion. Blowing [place] small amount of mucus but not a lot. Has postnasal drainage. He has a history of having had pneumonia last year.  Objective: White male, a little flushed looking from sun. TMs are normal. Sinuses not particularly tender. Throat was clear has a little edema of the uvula. Neck supple without significant nodes. Chest is clear to auscultation. Heart regular without murmurs.  Assessment: Congestion Generalized malaise and achiness Sinus pressure Cough Transient disorientation Fever  Plan: This probably represents a viral syndrome. We will check CBC to see what that look.  Results for orders placed in visit on 05/08/13  POCT CBC      Result Value Range   WBC 12.8 (*) 4.6 - 10.2 K/uL   Lymph, poc 1.8  0.6 - 3.4   POC LYMPH PERCENT 14.1  10 - 50 %L   MID (cbc) 0.7  0 - 0.9   POC MID % 5.1  0 - 12 %M   POC Granulocyte 10.3 (*) 2 - 6.9   Granulocyte percent 80.8 (*) 37 - 80 %G   RBC 5.50  4.69 - 6.13 M/uL   Hemoglobin 16.5  14.1 - 18.1 g/dL   HCT, POC 40.9  81.1 - 53.7 %   MCV 90.2  80 - 97 fL   MCH, POC 30.0  27 - 31.2 pg   MCHC 33.3  31.8 - 35.4 g/dL   RDW, POC 91.4     Platelet Count, POC 236  142 - 424 K/uL   MPV 10.5  0 - 99.8 fL

## 2013-05-08 NOTE — Patient Instructions (Signed)
Take the amoxicillin one twice daily  Use the fluticasone nose spray 2 sprays each nostril twice daily for 3 days, then once daily

## 2013-07-03 ENCOUNTER — Ambulatory Visit: Payer: Self-pay | Admitting: Family Medicine

## 2013-07-03 VITALS — BP 128/74 | HR 110 | Temp 99.5°F | Resp 18 | Ht 70.5 in | Wt 303.4 lb

## 2013-07-03 DIAGNOSIS — J209 Acute bronchitis, unspecified: Secondary | ICD-10-CM

## 2013-07-03 DIAGNOSIS — R05 Cough: Secondary | ICD-10-CM

## 2013-07-03 DIAGNOSIS — R509 Fever, unspecified: Secondary | ICD-10-CM

## 2013-07-03 LAB — POCT CBC
HCT, POC: 49.5 % (ref 43.5–53.7)
Hemoglobin: 16.6 g/dL (ref 14.1–18.1)
Lymph, poc: 1.4 (ref 0.6–3.4)
MCH, POC: 29.6 pg (ref 27–31.2)
MCHC: 33.5 g/dL (ref 31.8–35.4)
MCV: 88.4 fL (ref 80–97)
MPV: 10.5 fL (ref 0–99.8)
POC MID %: 4.1 %M (ref 0–12)
RBC: 5.6 M/uL (ref 4.69–6.13)
WBC: 18.1 10*3/uL — AB (ref 4.6–10.2)

## 2013-07-03 MED ORDER — LEVOFLOXACIN 500 MG PO TABS: 500.0000 mg | ORAL_TABLET | Freq: Every day | ORAL | Status: DC

## 2013-07-03 MED ORDER — HYDROCODONE-HOMATROPINE 5-1.5 MG/5ML PO SYRP: 5.0000 mL | ORAL_SOLUTION | Freq: Three times a day (TID) | ORAL | Status: DC | PRN

## 2013-07-03 NOTE — Patient Instructions (Addendum)

## 2013-07-03 NOTE — Progress Notes (Signed)
Subjective:    Patient ID: Alex Gibson, male    DOB: Nov 30, 1985, 27 y.o.   MRN: [id number] Chief Complaint  Patient presents with  . muscle aches    Started this morning  . Chills  . Cough   HPI  Was in his normal state of health until this morning when he woke with racking chills and subjective fever.  Every morning has an initial a.m. cough productive of clear mucous but seemed worse this morning.  Hurts his central chest to take a deep breath in. Minimal nasal congestion, no sore throat. No known sick contacts. Has been swallowing his phlegm so feels a little nauseas. Has not tried to eat or drink anything today.  Past Medical History  Diagnosis Date  . Bipolar disorder     Dr. Alphia Kava.  well controlled  . Anxiety   . Depression    Current Outpatient Prescriptions on File Prior to Visit  Medication Sig Dispense Refill  . ARIPiprazole (ABILIFY) 10 MG tablet Take 10 mg by mouth daily.      Marland Kitchen acetaminophen (TYLENOL) 325 MG tablet Take 650 mg by mouth every 6 (six) hours as needed. Took the tylenol rapid release at 11 am      . amoxicillin (AMOXIL) 875 MG tablet Take 1 tablet (875 mg total) by mouth 2 (two) times daily.  20 tablet  0  . fluticasone (FLONASE) 50 MCG/ACT nasal spray Place 2 sprays into the nose daily.  16 g  1  . guaiFENesin (MUCINEX) 600 MG 12 hr tablet Take 1,200 mg by mouth 2 (two) times daily.      Marland Kitchen ibuprofen (ADVIL,MOTRIN) 200 MG tablet Take 200 mg by mouth every 6 (six) hours as needed for pain.      Marland Kitchen loratadine (CLARITIN) 10 MG tablet Take 10 mg by mouth daily.       No current facility-administered medications on file prior to visit.   No Known Allergies   Review of Systems  Constitutional: Positive for fever, chills, diaphoresis, activity change, appetite change and fatigue. Negative for unexpected weight change.  HENT: Positive for congestion, sore throat and rhinorrhea. Negative for ear pain, mouth sores, neck pain, neck stiffness, postnasal drip  and sinus pressure.   Respiratory: Positive for cough. Negative for shortness of breath.   Cardiovascular: Positive for chest pain.  Gastrointestinal: Positive for nausea. Negative for vomiting, abdominal pain, diarrhea and constipation.  Genitourinary: Negative for dysuria.  Musculoskeletal: Positive for myalgias. Negative for arthralgias.  Skin: Negative for rash.  Neurological: Positive for headaches. Negative for syncope.  Hematological: Negative for adenopathy.  Psychiatric/Behavioral: Negative for sleep disturbance.       BP 128/74  Pulse 110  Temp(Src) 99.5 F (37.5 C) (Oral)  Resp 18  Ht 5' 10.5" (1.791 m)  Wt 303 lb 6.4 oz (137.621 kg)  BMI 42.9 kg/m2  SpO2 96% Objective:   Physical Exam  Constitutional: He is oriented to person, place, and time. He appears well-developed and well-nourished. No distress.  HENT:  Head: Normocephalic and atraumatic.  Right Ear: Tympanic membrane, external ear and ear canal normal.  Left Ear: Tympanic membrane, external ear and ear canal normal.  Nose: Nose normal.  Mouth/Throat: Mucous membranes are normal. Posterior oropharyngeal edema and posterior oropharyngeal erythema present. No oropharyngeal exudate.  Eyes: Conjunctivae are normal. No scleral icterus.  Neck: Normal range of motion. Neck supple. No thyromegaly present.  Cardiovascular: Normal rate, regular rhythm, normal heart sounds and intact distal pulses.  Pulmonary/Chest: Effort normal. No respiratory distress. He has decreased breath sounds. He has no wheezes. He has no rhonchi. He has no rales.  Poor inspiratory effort with freq cough on exp  Abdominal: Soft. Bowel sounds are normal. He exhibits no distension and no mass. There is no tenderness. There is no rebound and no guarding.  Exam limited by morbid obesity  Musculoskeletal: He exhibits no edema.  Lymphadenopathy:    He has no cervical adenopathy.  Neurological: He is alert and oriented to person, place, and time.   Skin: Skin is warm and dry. He is not diaphoretic. No erythema.  Psychiatric: He has a normal mood and affect. His behavior is normal.   Results for orders placed in visit on 07/03/13  POCT CBC      Result Value Range   WBC 18.1 (*) 4.6 - 10.2 K/uL   Lymph, poc 1.4  0.6 - 3.4   POC LYMPH PERCENT 7.5 (*) 10 - 50 %L   MID (cbc) 0.7  0 - 0.9   POC MID % 4.1  0 - 12 %M   POC Granulocyte 16.0 (*) 2 - 6.9   Granulocyte percent 88.4 (*) 37 - 80 %G   RBC 5.60  4.69 - 6.13 M/uL   Hemoglobin 16.6  14.1 - 18.1 g/dL   HCT, POC 40.9  81.1 - 53.7 %   MCV 88.4  80 - 97 fL   MCH, POC 29.6  27 - 31.2 pg   MCHC 33.5  31.8 - 35.4 g/dL   RDW, POC 91.4     Platelet Count, POC 194  142 - 424 K/uL   MPV 10.5  0 - 99.8 fL      peak flow:575  Goal: 630  Assessment & Plan:   Cough - Plan: POCT CBC  Fever, unspecified - Plan: POCT CBC  Acute bronchitis  Meds ordered this encounter  Medications  . levofloxacin (LEVAQUIN) 500 MG tablet    Sig: Take 1 tablet (500 mg total) by mouth daily.    Dispense:  7 tablet    Refill:  0  . HYDROcodone-homatropine (HYCODAN) 5-1.5 MG/5ML syrup    Sig: Take 5 mLs by mouth every 8 (eight) hours as needed for cough.    Dispense:  120 mL    Refill:  0    Norberto Sorenson, MD MPH

## 2013-10-30 ENCOUNTER — Ambulatory Visit (INDEPENDENT_AMBULATORY_CARE_PROVIDER_SITE_OTHER): Payer: Self-pay | Admitting: Family Medicine

## 2013-10-30 VITALS — BP 120/82 | HR 90 | Temp 99.0°F | Resp 16 | Ht 69.5 in | Wt 300.0 lb

## 2013-10-30 DIAGNOSIS — J069 Acute upper respiratory infection, unspecified: Secondary | ICD-10-CM

## 2013-10-30 DIAGNOSIS — J209 Acute bronchitis, unspecified: Secondary | ICD-10-CM

## 2013-10-30 MED ORDER — IPRATROPIUM BROMIDE 0.03 % NA SOLN: 2.0000 | Freq: Four times a day (QID) | NASAL | Status: DC

## 2013-10-30 MED ORDER — AZITHROMYCIN 250 MG PO TABS: ORAL_TABLET | ORAL | Status: DC

## 2013-10-30 MED ORDER — HYDROCODONE-HOMATROPINE 5-1.5 MG/5ML PO SYRP: 5.0000 mL | ORAL_SOLUTION | Freq: Three times a day (TID) | ORAL | Status: DC | PRN

## 2013-10-30 NOTE — Progress Notes (Signed)
Subjective:  This chart was scribed for Norberto Sorenson, MD by Carl Best, Medical Scribe. This patient was seen in Room 10 and the patient's care was started at 8:20 AM.   Patient ID: Alex Gibson, male    DOB: April 22, 1986, 27 y.o.   MRN: [id number] Chief Complaint  Patient presents with  . Cough    x   . Nasal Congestion    chest congestion  . Sore Throat  . Fever    HPI HPI Comments: Alex Gibson is a 27 y.o. male who presents to the [facility] complaining of constant rhinorrhea, photophobia, and cough that started four days ago.  He states that he noticed a tickling in his throat on the plane ride back from [place] five days ago.  He states that the next day he developed an elevated temp of 99.6 degrees, chills, sinus pressure, fatigue, chest congestion and headache.  He states that since then all of these symptoms have worsened.    Past Medical History  Diagnosis Date  . Bipolar disorder     Dr. Alphia Kava.  well controlled  . Anxiety   . Depression    Past Surgical History  Procedure Laterality Date  . Wisdom tooth extraction    . Wrist arthroscopy     Family History  Problem Relation Age of Onset  . Diabetes Father    History   Social History  . Marital Status: Single    Spouse Name: N/A    Number of Children: N/A  . Years of Education: N/A   Occupational History  . Not on file.   Social History Main Topics  . Smoking status: Never Smoker   . Smokeless tobacco: Never Used  . Alcohol Use: No  . Drug Use: No  . Sexual Activity: No   Other Topics Concern  . Not on file   Social History Narrative  . No narrative on file   No Known Allergies   Review of Systems  Constitutional: Positive for fever, chills and fatigue.  HENT: Positive for congestion, rhinorrhea, sinus pressure and sore throat.   Eyes: Positive for photophobia.  Respiratory: Positive for cough.   Neurological: Positive for headaches.     BP 120/82  Pulse 90   Temp(Src) 99 F (37.2 C) (Oral)  Resp 16  Ht 5' 9.5" (1.765 m)  Wt 300 lb (136.079 kg)  BMI 43.68 kg/m2  SpO2 97%  Objective:  Physical Exam  Nursing note and vitals reviewed. Constitutional: He is oriented to person, place, and time. He appears well-developed and well-nourished. No distress.  HENT:  Head: Normocephalic and atraumatic.  Right Ear: Hearing, external ear and ear canal normal. A middle ear effusion is present.  Left Ear: Hearing, external ear and ear canal normal. A middle ear effusion is present.  Nose: Nose normal.  Mouth/Throat: Uvula is midline and oropharynx is clear and moist. No oropharyngeal exudate.  Eyes: EOM are normal. Pupils are equal, round, and reactive to light.  Neck: Normal range of motion. Neck supple.  Cardiovascular: Normal rate, regular rhythm, normal heart sounds and intact distal pulses.   No murmur heard. Pulmonary/Chest: Effort normal and breath sounds normal. No respiratory distress. He has no decreased breath sounds. He has no wheezes. He has no rhonchi. He has no rales.  Lymphadenopathy:    He has no cervical adenopathy.  Neurological: He is alert and oriented to person, place, and time.  Skin: Skin is warm and dry.  Psychiatric: He has a normal mood and affect. His behavior is normal.    Assessment & Plan:  Acute bronchitis  URI, acute - suspect viral - cautioned that cough can be prolonged but should start feeling better within the next sev days. If sxs worsening, gave snap rx for zpack for pt to fill.  Meds ordered this encounter  Medications  . HYDROcodone-homatropine (HYCODAN) 5-1.5 MG/5ML syrup    Sig: Take 5 mLs by mouth every 8 (eight) hours as needed for cough.    Dispense:  120 mL    Refill:  0  . azithromycin (ZITHROMAX) 250 MG tablet    Sig: Take 2 tabs PO x 1 dose, then 1 tab PO QD x 4 days    Dispense:  6 tablet    Refill:  0  . ipratropium (ATROVENT) 0.03 % nasal spray    Sig: Place 2 sprays into the nose 4 (four)  times daily.    Dispense:  30 mL    Refill:  1    I personally performed the services described in this documentation, which was scribed in my presence. The recorded information has been reviewed and considered, and addended by me as needed.  Norberto Sorenson, MD MPH

## 2013-10-30 NOTE — Patient Instructions (Signed)
Acute Bronchitis Bronchitis is inflammation of the airways that extend from the windpipe into the lungs (bronchi). The inflammation often causes mucus to develop. This leads to a cough, which is the most common symptom of bronchitis.  In acute bronchitis, the condition usually develops suddenly and goes away over time, usually in a couple weeks. Smoking, allergies, and asthma can make bronchitis worse. Repeated episodes of bronchitis may cause further lung problems.  CAUSES Acute bronchitis is most often caused by the same virus that causes a cold. The virus can spread from person to person (contagious).  SIGNS AND SYMPTOMS   Cough.   Fever.   Coughing up mucus.   Body aches.   Chest congestion.   Chills.   Shortness of breath.   Sore throat.  DIAGNOSIS  Acute bronchitis is usually diagnosed through a physical exam. Tests, such as chest X-rays, are sometimes done to rule out other conditions.  TREATMENT  Acute bronchitis usually goes away in a couple weeks. Often times, no medical treatment is necessary. Medicines are sometimes given for relief of fever or cough. Antibiotics are usually not needed but may be prescribed in certain situations. In some cases, an inhaler may be recommended to help reduce shortness of breath and control the cough. A cool mist vaporizer may also be used to help thin bronchial secretions and make it easier to clear the chest.  HOME CARE INSTRUCTIONS  Get plenty of rest.   Drink enough fluids to keep your urine clear or pale yellow (unless you have a medical condition that requires fluid restriction). Increasing fluids may help thin your secretions and will prevent dehydration.   Only take over-the-counter or prescription medicines as directed by your health care provider.   Avoid smoking and secondhand smoke. Exposure to cigarette smoke or irritating chemicals will make bronchitis worse. If you are a smoker, consider using nicotine gum or skin  patches to help control withdrawal symptoms. Quitting smoking will help your lungs heal faster.   Reduce the chances of another bout of acute bronchitis by washing your hands frequently, avoiding people with cold symptoms, and trying not to touch your hands to your mouth, nose, or eyes.   Follow up with your health care provider as directed.  SEEK MEDICAL CARE IF: Your symptoms do not improve after 1 week of treatment.  SEEK IMMEDIATE MEDICAL CARE IF:  You develop an increased fever or chills.   You have chest pain.   You have severe shortness of breath.  You have bloody sputum.   You develop dehydration.  You develop fainting.  You develop repeated vomiting.  You develop a severe headache. MAKE SURE YOU:   Understand these instructions.  Will watch your condition.  Will get help right away if you are not doing well or get worse. Document Released: 11/26/2004 Document Revised: 06/21/2013 Document Reviewed: 04/11/2013 Exit[employer] Patient Information 2014 [employer].  

## 2014-03-18 ENCOUNTER — Ambulatory Visit: Payer: BC Managed Care – PPO | Admitting: Family Medicine

## 2014-03-18 VITALS — BP 126/88 | HR 78 | Temp 97.8°F | Resp 18 | Ht 68.75 in | Wt 310.6 lb

## 2014-03-18 DIAGNOSIS — M519 Unspecified thoracic, thoracolumbar and lumbosacral intervertebral disc disorder: Secondary | ICD-10-CM

## 2014-03-18 MED ORDER — METHYLPREDNISOLONE (PAK) 4 MG PO TABS: ORAL_TABLET | ORAL | Status: AC

## 2014-03-18 MED ORDER — HYDROCODONE-ACETAMINOPHEN 5-325 MG PO TABS: 1.0000 | ORAL_TABLET | Freq: Four times a day (QID) | ORAL | Status: AC | PRN

## 2014-03-18 NOTE — Patient Instructions (Signed)
Lumbosacral Strain Lumbosacral strain is a strain of any of the parts that make up your lumbosacral vertebrae. Your lumbosacral vertebrae are the bones that make up the lower third of your backbone. Your lumbosacral vertebrae are held together by muscles and tough, fibrous tissue (ligaments).  CAUSES  A sudden blow to your back can cause lumbosacral strain. Also, anything that causes an excessive stretch of the muscles in the low back can cause this strain. This is typically seen when people exert themselves strenuously, fall, lift heavy objects, bend, or crouch repeatedly. RISK FACTORS  Physically demanding work.  Participation in pushing or pulling sports or sports that require sudden twist of the back (tennis, golf, baseball).  Weight lifting.  Excessive lower back curvature.  Forward-tilted pelvis.  Weak back or abdominal muscles or both.  Tight hamstrings. SIGNS AND SYMPTOMS  Lumbosacral strain may cause pain in the area of your injury or pain that moves (radiates) down your leg.  DIAGNOSIS Your health care provider can often diagnose lumbosacral strain through a physical exam. In some cases, you may need tests such as X-ray exams.  TREATMENT  Treatment for your lower back injury depends on many factors that your clinician will have to evaluate. However, most treatment will include the use of anti-inflammatory medicines. HOME CARE INSTRUCTIONS   Avoid hard physical activities (tennis, racquetball, waterskiing) if you are not in proper physical condition for it. This may aggravate or create problems.  If you have a back problem, avoid sports requiring sudden body movements. Swimming and walking are generally safer activities.  Maintain good posture.  Maintain a healthy weight.  For acute conditions, you may put ice on the injured area.  Put ice in a plastic bag.  Place a towel between your skin and the bag.  Leave the ice on for 20 minutes, 2 3 times a day.  When the  low back starts healing, stretching and strengthening exercises may be recommended. SEEK MEDICAL CARE IF:  Your back pain is getting worse.  You experience severe back pain not relieved with medicines. SEEK IMMEDIATE MEDICAL CARE IF:   You have numbness, tingling, weakness, or problems with the use of your arms or legs.  There is a change in bowel or bladder control.  You have increasing pain in any area of the body, including your belly (abdomen).  You notice shortness of breath, dizziness, or feel faint.  You feel sick to your stomach (nauseous), are throwing up (vomiting), or become sweaty.  You notice discoloration of your toes or legs, or your feet get very cold. MAKE SURE YOU:   Understand these instructions.  Will watch your condition.  Will get help right away if you are not doing well or get worse. Document Released: 07/29/2005 Document Revised: 08/09/2013 Document Reviewed: 06/07/2013 Exit[employer] Patient Information 2014 [employer].  

## 2014-03-18 NOTE — Progress Notes (Signed)
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  Suddently had sharp pain right side while playing tennis again today around 1:30 today.  Pain is off to the  Right side without radiation.  Pain worsens with deep breath.  Objective:  NAD Patient lying supine on exam table SLR on right:  Positive.  On left:  Positive Knee reflexes symmetric, 1+;  Ankle reflex symmetric 1+  Assessment:  L5-S1 disc swelling most likely  Lumbar disc disease - Plan: methylPREDNIsolone (MEDROL DOSPACK) 4 MG tablet, HYDROcodone-acetaminophen (NORCO) 5-325 MG per tablet  Signed, Elvina SidleKurt Trinidi Toppins, MD

## 2015-06-03 ENCOUNTER — Emergency Department (HOSPITAL_COMMUNITY): Payer: BLUE CROSS/BLUE SHIELD

## 2015-06-03 ENCOUNTER — Encounter (HOSPITAL_COMMUNITY): Payer: Self-pay | Admitting: Emergency Medicine

## 2015-06-03 ENCOUNTER — Emergency Department (HOSPITAL_COMMUNITY)
Admission: EM | Admit: 2015-06-03 | Discharge: 2015-06-03 | Disposition: A | Discharge status: Home / Self Care | Payer: BLUE CROSS/BLUE SHIELD | Attending: Emergency Medicine | Admitting: Emergency Medicine

## 2015-06-03 DIAGNOSIS — F319 Bipolar disorder, unspecified: Secondary | ICD-10-CM | POA: Insufficient documentation

## 2015-06-03 DIAGNOSIS — N200 Calculus of kidney: Secondary | ICD-10-CM | POA: Diagnosis not present

## 2015-06-03 DIAGNOSIS — R7401 Elevation of levels of liver transaminase levels: Secondary | ICD-10-CM

## 2015-06-03 DIAGNOSIS — Z720 Tobacco use: Secondary | ICD-10-CM | POA: Insufficient documentation

## 2015-06-03 DIAGNOSIS — Z79899 Other long term (current) drug therapy: Secondary | ICD-10-CM | POA: Diagnosis not present

## 2015-06-03 DIAGNOSIS — R740 Nonspecific elevation of levels of transaminase and lactic acid dehydrogenase [LDH]: Secondary | ICD-10-CM | POA: Diagnosis not present

## 2015-06-03 DIAGNOSIS — R109 Unspecified abdominal pain: Secondary | ICD-10-CM

## 2015-06-03 LAB — COMPREHENSIVE METABOLIC PANEL
ALT: 218 U/L — AB (ref 17–63)
ANION GAP: 6 (ref 5–15)
AST: 99 U/L — ABNORMAL HIGH (ref 15–41)
Albumin: 3.9 g/dL (ref 3.5–5.0)
Alkaline Phosphatase: 65 U/L (ref 38–126)
BUN: 9 mg/dL (ref 6–20)
CALCIUM: 9.4 mg/dL (ref 8.9–10.3)
CHLORIDE: 105 mmol/L (ref 101–111)
CO2: 26 mmol/L (ref 22–32)
Creatinine, Ser: 0.94 mg/dL (ref 0.61–1.24)
GFR calc Af Amer: >60 mL/min (ref >60)
GFR calc non Af Amer: >60 mL/min (ref >60)
Glucose, Bld: 107 mg/dL — ABNORMAL HIGH (ref 65–99)
Potassium: 4.2 mmol/L (ref 3.5–5.1)
Sodium: 137 mmol/L (ref 135–145)
TOTAL PROTEIN: 7.6 g/dL (ref 6.5–8.1)
Total Bilirubin: 1.2 mg/dL (ref 0.3–1.2)

## 2015-06-03 LAB — CBC
HEMATOCRIT: 46.8 % (ref 39.0–52.0)
Hemoglobin: 16.6 g/dL (ref 13.0–17.0)
MCH: 29.2 pg (ref 26.0–34.0)
MCHC: 35.5 g/dL (ref 30.0–36.0)
MCV: 82.4 fL (ref 78.0–100.0)
Platelets: 180 10*3/uL (ref 150–400)
RBC: 5.68 MIL/uL (ref 4.22–5.81)
RDW: 13 % (ref 11.5–15.5)
WBC: 4.9 10*3/uL (ref 4.0–10.5)

## 2015-06-03 LAB — LIPASE, BLOOD: Lipase: 23 U/L (ref 22–51)

## 2015-06-03 LAB — URINALYSIS, ROUTINE W REFLEX MICROSCOPIC
Bilirubin Urine: NEGATIVE
GLUCOSE, UA: NEGATIVE mg/dL
Hgb urine dipstick: NEGATIVE
Ketones, ur: NEGATIVE mg/dL
LEUKOCYTES UA: NEGATIVE
NITRITE: NEGATIVE
Protein, ur: NEGATIVE mg/dL
SPECIFIC GRAVITY, URINE: 1.02 (ref 1.005–1.030)
Urobilinogen, UA: 0.2 mg/dL (ref 0.0–1.0)
pH: 7 (ref 5.0–8.0)

## 2015-06-03 MED ORDER — HYDROCODONE-ACETAMINOPHEN 5-325 MG PO TABS: 1.0000 | ORAL_TABLET | Freq: Four times a day (QID) | ORAL | Status: AC | PRN

## 2015-06-03 MED ORDER — KETOROLAC TROMETHAMINE 30 MG/ML IJ SOLN
30.0000 mg | Freq: Once | INTRAMUSCULAR | Status: AC
  Administered 2015-06-03: 30 mg via INTRAVENOUS
  Filled 2015-06-03: qty 1

## 2015-06-03 MED ORDER — ONDANSETRON 4 MG PO TBDP: 4.0000 mg | ORAL_TABLET | Freq: Four times a day (QID) | ORAL | Status: AC | PRN

## 2015-06-03 NOTE — Discharge Instructions (Signed)

## 2015-06-03 NOTE — ED Provider Notes (Signed)
CSN: [id number]     Arrival date & time 06/03/15  8295 History   First MD Initiated Contact with Patient 06/03/15 0654     Chief Complaint  Patient presents with  . Flank Pain  . Nausea  . Abdominal Pain     (Consider location/radiation/quality/duration/timing/severity/associated sxs/prior Treatment) Patient is a 29 y.o. male presenting with flank pain and abdominal pain. The history is provided by the patient.  Flank Pain This is a new problem. The current episode started 3 to 5 hours ago. The problem occurs constantly (waxing and waning). The problem has not changed since onset.Pertinent negatives include no abdominal pain and no shortness of breath. Nothing aggravates the symptoms. Nothing relieves the symptoms. He has tried nothing for the symptoms.  Abdominal Pain Associated symptoms: nausea   Associated symptoms: no cough, no diarrhea, no fever, no shortness of breath and no vomiting     Past Medical History  Diagnosis Date  . Bipolar disorder     Dr. Alphia Kava.  well controlled  . Anxiety   . Depression    Past Surgical History  Procedure Laterality Date  . Wisdom tooth extraction    . Wrist arthroscopy     Family History  Problem Relation Age of Onset  . Diabetes Father    History  Substance Use Topics  . Smoking status: Current Some Day Smoker  . Smokeless tobacco: Never Used  . Alcohol Use: No    Review of Systems  Constitutional: Negative for fever.  Respiratory: Negative for cough and shortness of breath.   Gastrointestinal: Positive for nausea. Negative for vomiting, abdominal pain and diarrhea.  Genitourinary: Positive for flank pain.  All other systems reviewed and are negative.     Allergies  Review of patient's allergies indicates no known allergies.  Home Medications   Prior to Admission medications   Medication Sig Start Date End Date Taking? Authorizing Provider  acetaminophen (TYLENOL) 325 MG tablet Take 650 mg by mouth every 6 (six) hours  as needed. Took the tylenol rapid release at 11 am    Historical Provider, MD  ARIPiprazole (ABILIFY) 10 MG tablet Take 5 mg by mouth daily.     Historical Provider, MD  guaiFENesin (MUCINEX) 600 MG 12 hr tablet Take 1,200 mg by mouth 2 (two) times daily.    Historical Provider, MD  HYDROcodone-acetaminophen (NORCO) 5-325 MG per tablet Take 1 tablet by mouth every 6 (six) hours as needed for moderate pain. 03/18/14   Elvina Sidle, MD  ibuprofen (ADVIL,MOTRIN) 200 MG tablet Take 200 mg by mouth every 6 (six) hours as needed for pain.    Historical Provider, MD  methylPREDNIsolone (MEDROL DOSPACK) 4 MG tablet follow package directions 03/18/14   Elvina Sidle, MD   BP 141/88 mmHg  Pulse 81  Temp(Src) 97.5 F (36.4 C) (Oral)  Resp 20  SpO2 97% Physical Exam  Constitutional: He is oriented to person, place, and time. He appears well-developed and well-nourished. No distress.  HENT:  Head: Normocephalic and atraumatic.  Mouth/Throat: Oropharynx is clear and moist. No oropharyngeal exudate.  Eyes: EOM are normal. Pupils are equal, round, and reactive to light.  Neck: Normal range of motion. Neck supple.  Cardiovascular: Normal rate and regular rhythm.  Exam reveals no friction rub.   No murmur heard. Pulmonary/Chest: Effort normal and breath sounds normal. No respiratory distress. He has no wheezes. He has no rales.  Abdominal: Soft. He exhibits no distension. There is no tenderness. There is no rebound.  Musculoskeletal:  Normal range of motion. He exhibits no edema.  Neurological: He is alert and oriented to person, place, and time.  Skin: No rash noted. He is not diaphoretic.  Nursing note and vitals reviewed.   ED Course  Procedures (including critical care time) Labs Review Labs Reviewed  LIPASE, BLOOD  COMPREHENSIVE METABOLIC PANEL  CBC  URINALYSIS, ROUTINE W REFLEX MICROSCOPIC (NOT AT [facility])    Imaging Review Ct Renal Stone Study  06/03/2015   CLINICAL DATA:  One day  history of nausea and right flank pain  EXAM: CT ABDOMEN AND PELVIS WITHOUT CONTRAST  TECHNIQUE: Multidetector CT imaging of the abdomen and pelvis was performed following the standard protocol without oral or intravenous contrast material administration.  COMPARISON:  None.  FINDINGS: There is a small calcified granuloma in the anterior left base region. Lung bases are otherwise clear.  No focal liver lesions are identified on this noncontrast enhanced study. Gallbladder wall is not appreciably thickened. There is no biliary duct dilatation.  Spleen, pancreas, and adrenals appear normal.  Kidneys bilaterally show no evidence of mass or hydronephrosis on either side. There is no renal or ureteral calculus on either side.  In the pelvis, the urinary bladder is midline. The urinary bladder is largely decompressed. The wall thickness is felt to be within normal limits given the degree of wall thickness present. There is a tiny calcification along the posterior wall of the urinary bladder which may represent a recently passed calculus. It is best seen on axial slice 94 series 2. There is no pelvic mass or pelvic fluid collection. There is fat in the left inguinal ring. The appendix appears normal. Terminal ileum appears normal.  There is no bowel obstruction. No free air or portal venous air. There is no demonstrable ascites, adenopathy, or abscess in the abdomen or pelvis. There is no abdominal aortic aneurysm. There are no blastic or lytic bone lesions.  IMPRESSION: No hydronephrosis on either side. No renal or ureteral calculus. There is a tiny calcification in the posterior urinary bladder immediately to the right of midline. Suspect recently passed tiny calculus.  No bowel obstruction. No abscess. No bowel wall thickening or mesenteric thickening.   Electronically Signed   By: Bretta Bang III M.D.   On: 06/03/2015 08:16   US Abdomen Limited Ruq  06/03/2015   CLINICAL DATA:  Transaminitis and upper abdominal  pain  EXAM: US ABDOMEN LIMITED - RIGHT UPPER QUADRANT  COMPARISON:  CT abdomen and pelvis June 03, 2015  FINDINGS: Gallbladder:  No gallstones or wall thickening visualized. There is no pericholecystic fluid. No sonographic Murphy sign noted.  Common bile duct:  Diameter: 4 mm. There is no and for hepatic or extrahepatic biliary duct dilatation.  Liver:  No focal lesion identified. Liver echogenicity appears somewhat increased.  IMPRESSION: Liver echogenicity appears increased by ultrasound, a finding that may be indicative of a degree of hepatic steatosis. It should be noted that the sensitivity of ultrasound for focal liver lesions is diminished given this degree of increased echogenicity. The noncontrast enhanced CT study obtained earlier in the day does not show obvious hepatic steatosis.   Electronically Signed   By: Bretta Bang III M.D.   On: 06/03/2015 09:32     EKG Interpretation None      MDM   Final diagnoses:  Right flank pain  Transaminitis  Kidney stone    8M here with R flank pain. Mild associated nausea. No diarrhea. No radiation. Waxing and waning since  onset. No fevers. AFVSS here. R flank pain with deep palpation. No RUQ or anterior abdominal pain. Will scan for stones. CT shows possible recently passed small stone. No hydronephrosis. Labs show mild transaminitis, ALT > AST. US of the liver shows possible steatosis, however the CT did not show anything. Instructed him to f/u with PCP.  Stable for discharge.  I have reviewed all labs and imaging and considered them in my medical decision making.    Elwin Mocha, MD 06/03/15 [phone]

## 2015-06-03 NOTE — ED Notes (Signed)
MD at bedside. 

## 2015-06-03 NOTE — ED Notes (Signed)
Patient here with complaint of right lateral abdominal pain and right flank pain. States some back pain in the area earlier this week. Pain improves with laying down. Percussion does not exacerbate pain. States transient nausea. Denies fevers.

## 2017-04-11 IMAGING — CT CT RENAL STONE PROTOCOL
2 of 4 series · 16 of 46 positions shown, 18 images · non-contrast
Comparison: None.

CLINICAL DATA: One day history of nausea and right flank pain

EXAM:
CT ABDOMEN AND PELVIS WITHOUT CONTRAST
TECHNIQUE: Multidetector CT imaging of the abdomen and pelvis was performed
following the standard protocol without oral or intravenous contrast
material administration.

[Series 2: stone study 5.0 i30f 1 · axial · 0.98mm/px · z∈[+854,+1339]mm · 13 of 107 slices shown, 15 images]
[im 5/107  soft-tissue]
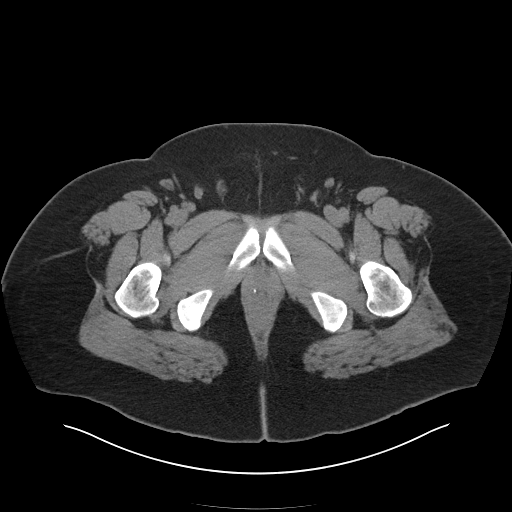
[im 5/107  bone]
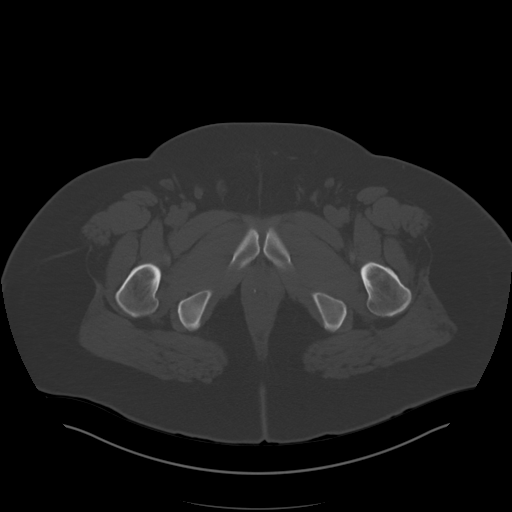
[im 14/107  soft-tissue]
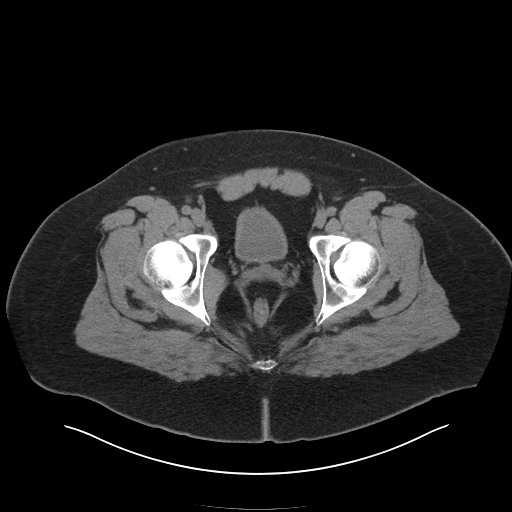
[im 23/107  soft-tissue]
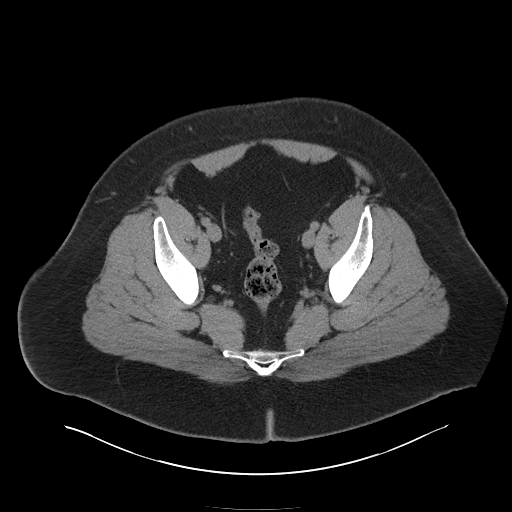
[im 31/107  soft-tissue]
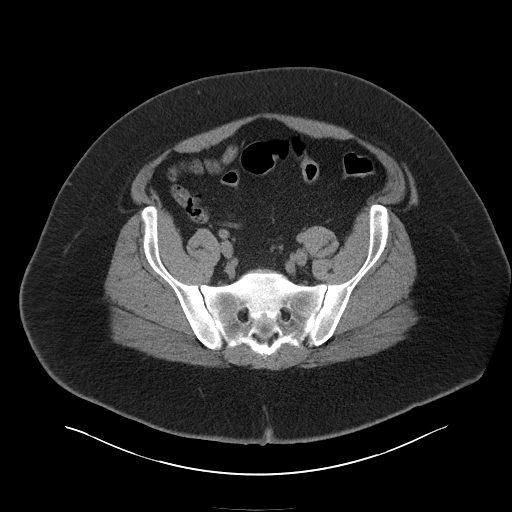
[im 36/107  soft-tissue]
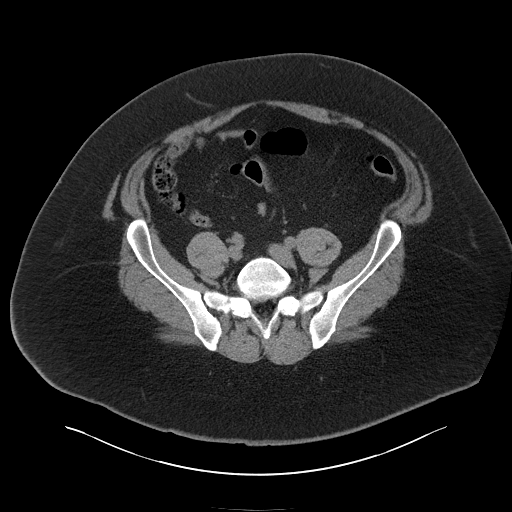
[im 45/107  soft-tissue]
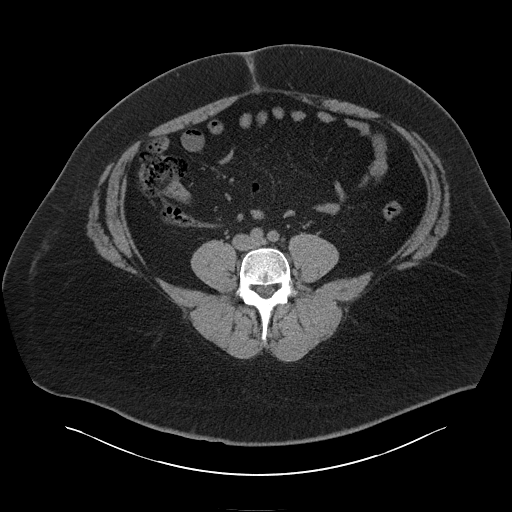
[im 54/107  soft-tissue]
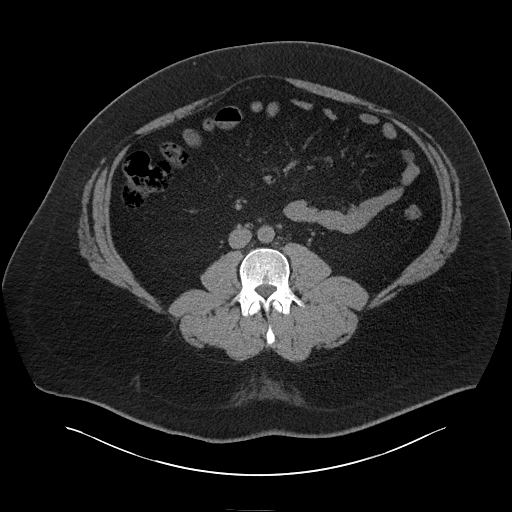
[im 62/107  soft-tissue]
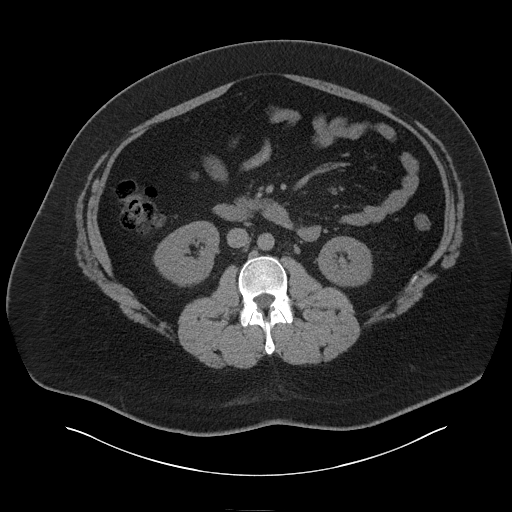
[im 71/107  soft-tissue]
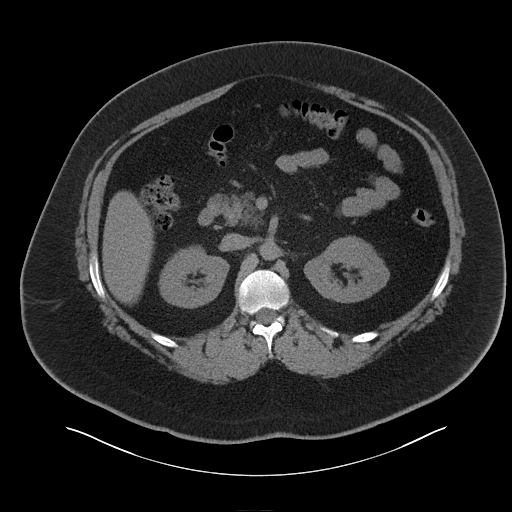
[im 71/107  bone]
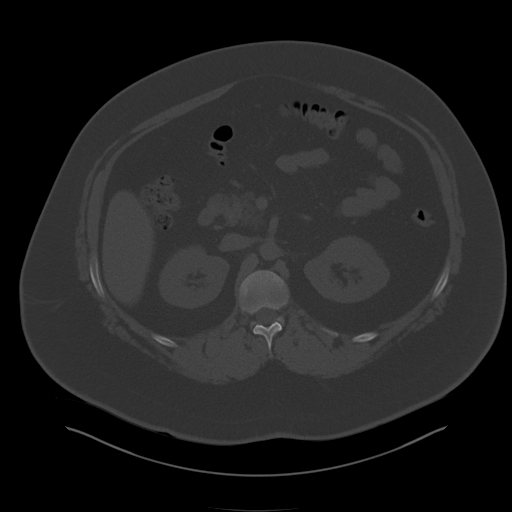
[im 76/107  soft-tissue]
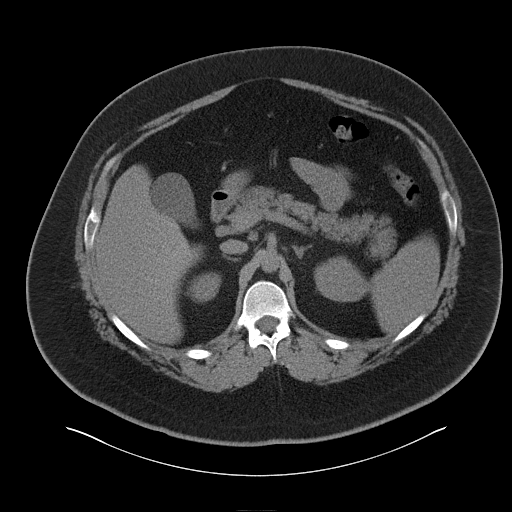
[im 84/107  soft-tissue]
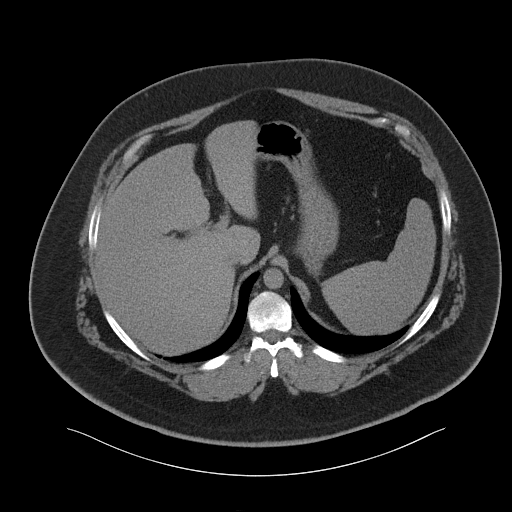
[im 93/107  soft-tissue]
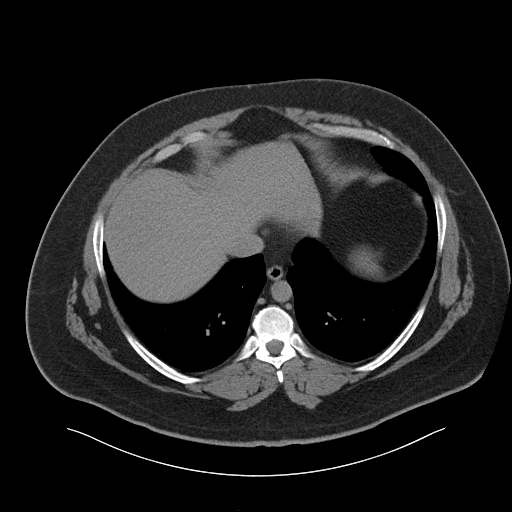
[im 102/107  soft-tissue]
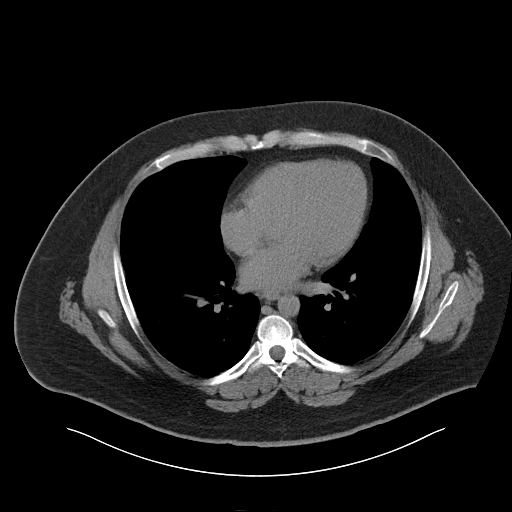

[Series 5: coronal soft tissue · coronal · 0.95mm/px · 3 of 105 slices shown]
[im 35/105  soft-tissue]
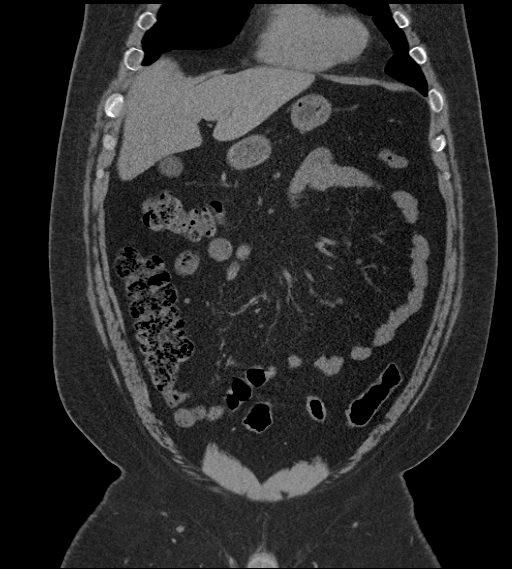
[im 47/105  soft-tissue]
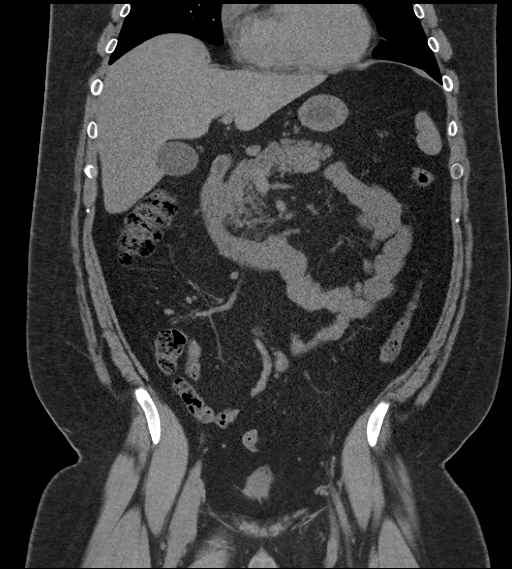
[im 58/105  soft-tissue]
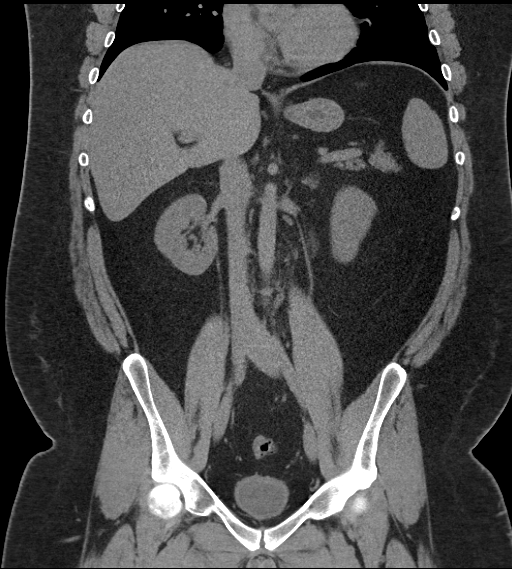

[16 of 46 positions shown; findings below may reference images not displayed]

FINDINGS: There is a small calcified granuloma in the anterior left base
region. Lung bases are otherwise clear.

No focal liver lesions are identified on this noncontrast enhanced
study. Gallbladder wall is not appreciably thickened. There is no
biliary duct dilatation.

Spleen, pancreas, and adrenals appear normal.

Kidneys bilaterally show no evidence of mass or hydronephrosis on
either side. There is no renal or ureteral calculus on either side.

In the pelvis, the urinary bladder is midline. The urinary bladder
is largely decompressed. The wall thickness is felt to be within
normal limits given the degree of wall thickness present. There is a
tiny calcification along the posterior wall of the urinary bladder
which may represent a recently passed calculus. It is best seen on
axial slice 94 series 2. There is no pelvic mass or pelvic fluid
collection. There is fat in the left inguinal ring. The appendix
appears normal. Terminal ileum appears normal.

There is no bowel obstruction. No free air or portal venous air.
There is no demonstrable ascites, adenopathy, or abscess in the
abdomen or pelvis. There is no abdominal aortic aneurysm. There are
no blastic or lytic bone lesions.
IMPRESSION: No hydronephrosis on either side. No renal or ureteral calculus.
There is a tiny calcification in the posterior urinary bladder
immediately to the right of midline. Suspect recently passed tiny
calculus.

No bowel obstruction. No abscess. No bowel wall thickening or
mesenteric thickening.
